# Patient Record
Sex: Male | Born: 1940 | Race: White | Hispanic: No | Marital: Married | Smoking: Former smoker
Health system: Southern US, Community
[De-identification: ages and names within clinical notes are randomized; demographics above are authoritative.]

## PROBLEM LIST (undated history)

## (undated) DIAGNOSIS — M1990 Unspecified osteoarthritis, unspecified site: Secondary | ICD-10-CM

## (undated) DIAGNOSIS — N189 Chronic kidney disease, unspecified: Secondary | ICD-10-CM

## (undated) DIAGNOSIS — I10 Essential (primary) hypertension: Secondary | ICD-10-CM

## (undated) DIAGNOSIS — C801 Malignant (primary) neoplasm, unspecified: Secondary | ICD-10-CM

## (undated) DIAGNOSIS — Z9981 Dependence on supplemental oxygen: Secondary | ICD-10-CM

## (undated) DIAGNOSIS — J189 Pneumonia, unspecified organism: Secondary | ICD-10-CM

## (undated) HISTORY — PX: BACK SURGERY: SHX140

## (undated) HISTORY — PX: OTHER SURGICAL HISTORY: SHX169

## (undated) HISTORY — PX: SINUS SURGERY WITH INSTATRAK: SHX5215

## (undated) HISTORY — PX: ADENOIDECTOMY: SUR15

---

## 2016-01-19 DEATH — deceased

## 2020-07-01 ENCOUNTER — Other Ambulatory Visit: Payer: Self-pay | Admitting: Orthopedic Surgery

## 2020-07-01 DIAGNOSIS — M19012 Primary osteoarthritis, left shoulder: Secondary | ICD-10-CM

## 2020-07-05 ENCOUNTER — Other Ambulatory Visit: Payer: Self-pay | Admitting: Orthopedic Surgery

## 2020-07-20 ENCOUNTER — Ambulatory Visit
Admission: RE | Admit: 2020-07-20 | Discharge: 2020-07-20 | Disposition: A | Discharge status: Home / Self Care | Payer: Medicare Other | Source: Ambulatory Visit | Attending: Orthopedic Surgery | Admitting: Orthopedic Surgery

## 2020-07-20 ENCOUNTER — Other Ambulatory Visit: Payer: Self-pay

## 2020-07-20 DIAGNOSIS — M19012 Primary osteoarthritis, left shoulder: Secondary | ICD-10-CM

## 2020-07-21 NOTE — Progress Notes (Addendum)
DUE TO COVID-19 ONLY ONE VISITOR IS ALLOWED TO COME WITH YOU AND STAY IN THE WAITING ROOM ONLY DURING PRE OP AND PROCEDURE DAY OF SURGERY. THE 1 VISITOR  MAY VISIT WITH YOU AFTER SURGERY IN YOUR PRIVATE ROOM DURING VISITING HOURS ONLY!  YOU NEED TO HAVE A COVID 19 TEST ON__12/01/2020 _____ @_______ , THIS TEST MUST BE DONE BEFORE SURGERY,  COVID TESTING SITE [address] [place] [address], IT IS ON THE RIGHT GOING OUT [address] APPROXIMATELY  2 MINUTES PAST [employer] ON THE RIGHT. ONCE YOUR COVID TEST IS COMPLETED,  PLEASE BEGIN THE QUARANTINE INSTRUCTIONS AS OUTLINED IN YOUR HANDOUT.                Hiran Leard  07/21/2020   Your procedure is scheduled on: 07/28/2020    Report to [facility] Main  Entrance   Report to admitting at   0700am  AM     Call this number if you have problems the morning of surgery [phone]    REMEMBER: NO  SOLID FOOD CANDY OR GUM AFTER MIDNIGHT. CLEAR LIQUIDS UNTIL 0630am         . NOTHING BY MOUTH EXCEPT CLEAR LIQUIDS UNTIL    . PLEASE FINISH ENSURE DRINK PER SURGEON ORDER  WHICH NEEDS TO BE COMPLETED AT 0630am     .      CLEAR LIQUID DIET   Foods Allowed                                                                    Coffee and tea, regular and decaf                            Fruit ices (not with fruit pulp)                                      Iced Popsicles                                    Carbonated beverages, regular and diet                                    Cranberry, grape and apple juices Sports drinks like Gatorade Lightly seasoned clear broth or consume(fat free) Sugar, honey syrup ___________________________________________________________________      BRUSH YOUR TEETH MORNING OF SURGERY AND RINSE YOUR MOUTH OUT, NO CHEWING GUM CANDY OR MINTS.     Take these medicines the morning of surgery with A SIP OF WATER: Coreg, Lyrica   DO NOT TAKE ANY DIABETIC MEDICATIONS DAY OF YOUR SURGERY                                You may not have any metal on your body including hair pins and              piercings  Do not wear  jewelry, make-up, lotions, powders or perfumes, deodorant             Do not wear nail polish on your fingernails.  Do not shave  48 hours prior to surgery.              Men may shave face and neck.   Do not bring valuables to the hospital. [place].  Contacts, dentures or bridgework may not be worn into surgery.  Leave suitcase in the car. After surgery it may be brought to your room.     Patients discharged the day of surgery will not be allowed to drive home. IF YOU ARE HAVING SURGERY AND GOING HOME THE SAME DAY, YOU MUST HAVE AN ADULT TO DRIVE YOU HOME AND BE WITH YOU FOR 24 HOURS. YOU MAY GO HOME BY TAXI OR UBER OR ORTHERWISE, BUT AN ADULT MUST ACCOMPANY YOU HOME AND STAY WITH YOU FOR 24 HOURS.  Name and phone number of your driver:  Special Instructions: N/A              Please read over the following fact sheets you were given: _____________________________________________________________________  [facility] - Preparing for Surgery Before surgery, you can play an important role.  Because skin is not sterile, your skin needs to be as free of germs as possible.  You can reduce the number of germs on your skin by washing with CHG (chlorahexidine gluconate) soap before surgery.  CHG is an antiseptic cleaner which kills germs and bonds with the skin to continue killing germs even after washing. Please DO NOT use if you have an allergy to CHG or antibacterial soaps.  If your skin becomes reddened/irritated stop using the CHG and inform your nurse when you arrive at Short Stay. Do not shave (including legs and underarms) for at least 48 hours prior to the first CHG shower.  You may shave your face/neck. Please follow these instructions carefully:  1.  Shower with CHG Soap the night before surgery and the  morning of  Surgery.  2.  If you choose to wash your hair, wash your hair first as usual with your  normal  shampoo.  3.  After you shampoo, rinse your hair and body thoroughly to remove the  shampoo.                           4.  Use CHG as you would any other liquid soap.  You can apply chg directly  to the skin and wash                       Gently with a scrungie or clean washcloth.  5.  Apply the CHG Soap to your body ONLY FROM THE NECK DOWN.   Do not use on face/ open                           Wound or open sores. Avoid contact with eyes, ears mouth and genitals (private parts).                       Wash face,  Genitals (private parts) with your normal soap.             6.  Wash thoroughly, paying special  attention to the area where your surgery  will be performed.  7.  Thoroughly rinse your body with warm water from the neck down.  8.  DO NOT shower/wash with your normal soap after using and rinsing off  the CHG Soap.                9.  Pat yourself dry with a clean towel.            10.  Wear clean pajamas.            11.  Place clean sheets on your bed the night of your first shower and do not  sleep with pets. Day of Surgery : Do not apply any lotions/deodorants the morning of surgery.  Please wear clean clothes to the hospital/surgery center.  FAILURE TO FOLLOW THESE INSTRUCTIONS MAY RESULT IN THE CANCELLATION OF YOUR SURGERY PATIENT SIGNATURE_________________________________  NURSE SIGNATURE__________________________________  ________________________________________________________________________  [facility]- Preparing for Total Shoulder Arthroplasty    Before surgery, you can play an important role. Because skin is not sterile, your skin needs to be as free of germs as possible. You can reduce the number of germs on your skin by using the following products.  Benzoyl Peroxide Gel o Reduces the number of germs present on the skin o Applied twice a day to shoulder area starting two days  before surgery    ==================================================================  Please follow these instructions carefully:  BENZOYL PEROXIDE 5% GEL  Please do not use if you have an allergy to benzoyl peroxide.   If your skin becomes reddened/irritated stop using the benzoyl peroxide.  Starting two days before surgery, apply as follows: 1. Apply benzoyl peroxide in the morning and at night. Apply after taking a shower. If you are not taking a shower clean entire shoulder front, back, and side along with the armpit with a clean wet washcloth.  2. Place a quarter-sized dollop on your shoulder and rub in thoroughly, making sure to cover the front, back, and side of your shoulder, along with the armpit.   2 days before ____ AM   ____ PM              1 day before ____ AM   ____ PM                         3. Do this twice a day for two days.  (Last application is the night before surgery, AFTER using the CHG soap as described below).  4. Do NOT apply benzoyl peroxide gel on the day of surgery.

## 2020-07-25 ENCOUNTER — Encounter (HOSPITAL_COMMUNITY): Payer: Self-pay

## 2020-07-25 ENCOUNTER — Encounter (HOSPITAL_COMMUNITY)
Admission: RE | Admit: 2020-07-25 | Discharge: 2020-07-25 | Disposition: A | Discharge status: Home / Self Care | Payer: Medicare Other | Source: Ambulatory Visit | Attending: Orthopedic Surgery | Admitting: Orthopedic Surgery

## 2020-07-25 ENCOUNTER — Ambulatory Visit (HOSPITAL_COMMUNITY)
Admission: RE | Admit: 2020-07-25 | Discharge: 2020-07-25 | Disposition: A | Discharge status: Home / Self Care | Payer: Medicare Other | Source: Ambulatory Visit | Attending: Orthopedic Surgery | Admitting: Orthopedic Surgery

## 2020-07-25 ENCOUNTER — Other Ambulatory Visit (HOSPITAL_COMMUNITY)
Admission: RE | Admit: 2020-07-25 | Discharge: 2020-07-25 | Disposition: A | Discharge status: Home / Self Care | Payer: Medicare Other | Source: Ambulatory Visit | Attending: Orthopedic Surgery | Admitting: Orthopedic Surgery

## 2020-07-25 ENCOUNTER — Other Ambulatory Visit: Payer: Self-pay

## 2020-07-25 DIAGNOSIS — Z79899 Other long term (current) drug therapy: Secondary | ICD-10-CM | POA: Insufficient documentation

## 2020-07-25 DIAGNOSIS — Z01818 Encounter for other preprocedural examination: Secondary | ICD-10-CM | POA: Diagnosis present

## 2020-07-25 DIAGNOSIS — Z87891 Personal history of nicotine dependence: Secondary | ICD-10-CM | POA: Diagnosis not present

## 2020-07-25 DIAGNOSIS — Z20822 Contact with and (suspected) exposure to covid-19: Secondary | ICD-10-CM | POA: Insufficient documentation

## 2020-07-25 DIAGNOSIS — Z7982 Long term (current) use of aspirin: Secondary | ICD-10-CM | POA: Insufficient documentation

## 2020-07-25 DIAGNOSIS — R918 Other nonspecific abnormal finding of lung field: Secondary | ICD-10-CM | POA: Insufficient documentation

## 2020-07-25 DIAGNOSIS — N182 Chronic kidney disease, stage 2 (mild): Secondary | ICD-10-CM | POA: Diagnosis not present

## 2020-07-25 DIAGNOSIS — Z01811 Encounter for preprocedural respiratory examination: Secondary | ICD-10-CM

## 2020-07-25 DIAGNOSIS — Z01812 Encounter for preprocedural laboratory examination: Secondary | ICD-10-CM | POA: Insufficient documentation

## 2020-07-25 DIAGNOSIS — I129 Hypertensive chronic kidney disease with stage 1 through stage 4 chronic kidney disease, or unspecified chronic kidney disease: Secondary | ICD-10-CM | POA: Diagnosis not present

## 2020-07-25 DIAGNOSIS — M19012 Primary osteoarthritis, left shoulder: Secondary | ICD-10-CM | POA: Insufficient documentation

## 2020-07-25 HISTORY — DX: Unspecified osteoarthritis, unspecified site: M19.90

## 2020-07-25 HISTORY — DX: Essential (primary) hypertension: I10

## 2020-07-25 HISTORY — DX: Malignant (primary) neoplasm, unspecified: C80.1

## 2020-07-25 HISTORY — DX: Chronic kidney disease, unspecified: N18.9

## 2020-07-25 HISTORY — DX: Pneumonia, unspecified organism: J18.9

## 2020-07-25 HISTORY — DX: Dependence on supplemental oxygen: Z99.81

## 2020-07-25 LAB — URINALYSIS, ROUTINE W REFLEX MICROSCOPIC
Bilirubin Urine: NEGATIVE
Glucose, UA: NEGATIVE mg/dL
Hgb urine dipstick: NEGATIVE
Ketones, ur: NEGATIVE mg/dL
Leukocytes,Ua: NEGATIVE
Nitrite: NEGATIVE
Protein, ur: 30 mg/dL — AB
Specific Gravity, Urine: 1.02 (ref 1.005–1.030)
pH: 5 (ref 5.0–8.0)

## 2020-07-25 LAB — COMPREHENSIVE METABOLIC PANEL
ALT: 24 U/L (ref 0–44)
AST: 29 U/L (ref 15–41)
Albumin: 3.8 g/dL (ref 3.5–5.0)
Alkaline Phosphatase: 49 U/L (ref 38–126)
Anion gap: 9 (ref 5–15)
BUN: 26 mg/dL — ABNORMAL HIGH (ref 8–23)
CO2: 26 mmol/L (ref 22–32)
Calcium: 9.1 mg/dL (ref 8.9–10.3)
Chloride: 105 mmol/L (ref 98–111)
Creatinine, Ser: 1.47 mg/dL — ABNORMAL HIGH (ref 0.61–1.24)
GFR, Estimated: 49 mL/min — ABNORMAL LOW (ref >60)
Glucose, Bld: 100 mg/dL — ABNORMAL HIGH (ref 70–99)
Potassium: 4.1 mmol/L (ref 3.5–5.1)
Sodium: 140 mmol/L (ref 135–145)
Total Bilirubin: 0.7 mg/dL (ref 0.3–1.2)
Total Protein: 7.1 g/dL (ref 6.5–8.1)

## 2020-07-25 LAB — CBC WITH DIFFERENTIAL/PLATELET
Abs Immature Granulocytes: 0.02 10*3/uL (ref 0.00–0.07)
Basophils Absolute: 0 10*3/uL (ref 0.0–0.1)
Basophils Relative: 1 %
Eosinophils Absolute: 0.3 10*3/uL (ref 0.0–0.5)
Eosinophils Relative: 6 %
HCT: 49 % (ref 39.0–52.0)
Hemoglobin: 15.5 g/dL (ref 13.0–17.0)
Immature Granulocytes: 1 %
Lymphocytes Relative: 22 %
Lymphs Abs: 1 10*3/uL (ref 0.7–4.0)
MCH: 29 pg (ref 26.0–34.0)
MCHC: 31.6 g/dL (ref 30.0–36.0)
MCV: 91.6 fL (ref 80.0–100.0)
Monocytes Absolute: 0.5 10*3/uL (ref 0.1–1.0)
Monocytes Relative: 11 %
Neutro Abs: 2.6 10*3/uL (ref 1.7–7.7)
Neutrophils Relative %: 59 %
Platelets: 161 10*3/uL (ref 150–400)
RBC: 5.35 MIL/uL (ref 4.22–5.81)
RDW: 16.9 % — ABNORMAL HIGH (ref 11.5–15.5)
WBC: 4.4 10*3/uL (ref 4.0–10.5)
nRBC: 0 % (ref 0.0–0.2)

## 2020-07-25 LAB — SARS CORONAVIRUS 2 (TAT 6-24 HRS): SARS Coronavirus 2: NEGATIVE

## 2020-07-25 LAB — SURGICAL PCR SCREEN
MRSA, PCR: NEGATIVE
Staphylococcus aureus: NEGATIVE

## 2020-07-25 LAB — PROTIME-INR
INR: 1.1 (ref 0.8–1.2)
Prothrombin Time: 13.3 seconds (ref 11.4–15.2)

## 2020-07-25 LAB — TYPE AND SCREEN
ABO/RH(D): AB POS
Antibody Screen: NEGATIVE

## 2020-07-25 LAB — APTT: aPTT: 31 seconds (ref 24–36)

## 2020-07-25 NOTE — Progress Notes (Addendum)
Anesthesia Review:  PCP: DR Franki Monte [facility]- 07/15/20   Cardiologist : DR Lamar Blinks- [facility]- 10/2019 Have requested clearance from Bethena Roys at [facility] - they are to fax  Clearance Dr Mauricio Po. On chart dated 07/05/2020.  Chest x-ray :07/25/2020  EKG :07/25/2020  Echo : Stress test: Cardiac Cath :  Activity level: cannot do a flight of stairs without difficulty  Sleep Study/ CPAP :no  Fasting Blood Sugar :      / Checks Blood Sugar -- times a day:   Blood Thinner/ Instructions /Last Dose: ASA / Instructions/ Last Dose :  Due to Covid in 07/2019 is on oxygen 2l at hs per pt  U/A and CMP  done 07/25/2020 routed to Dr Tamera Punt.  CXR done 07/25/2020 routed via epic to DR Boeing.

## 2020-07-27 MED ORDER — DEXTROSE 5 % IV SOLN
3.0000 g | INTRAVENOUS | Status: AC
  Administered 2020-07-28: 2 g via INTRAVENOUS
  Filled 2020-07-27: qty 3

## 2020-07-28 ENCOUNTER — Observation Stay (HOSPITAL_COMMUNITY): Payer: Medicare Other

## 2020-07-28 ENCOUNTER — Encounter (HOSPITAL_COMMUNITY)
Admission: RE | Disposition: A | Discharge status: Home / Self Care | Payer: Self-pay | Source: Home / Self Care | Attending: Orthopedic Surgery

## 2020-07-28 ENCOUNTER — Ambulatory Visit (HOSPITAL_COMMUNITY): Payer: Medicare Other | Admitting: Anesthesiology

## 2020-07-28 ENCOUNTER — Observation Stay (HOSPITAL_COMMUNITY)
Admission: RE | Admit: 2020-07-28 | Discharge: 2020-07-29 | Disposition: A | Discharge status: Home / Self Care | Payer: Medicare Other | Attending: Orthopedic Surgery | Admitting: Orthopedic Surgery

## 2020-07-28 ENCOUNTER — Ambulatory Visit (HOSPITAL_COMMUNITY): Payer: Medicare Other | Admitting: Physician Assistant

## 2020-07-28 DIAGNOSIS — M19012 Primary osteoarthritis, left shoulder: Secondary | ICD-10-CM | POA: Diagnosis present

## 2020-07-28 DIAGNOSIS — Z7982 Long term (current) use of aspirin: Secondary | ICD-10-CM | POA: Insufficient documentation

## 2020-07-28 DIAGNOSIS — N182 Chronic kidney disease, stage 2 (mild): Secondary | ICD-10-CM | POA: Diagnosis not present

## 2020-07-28 DIAGNOSIS — I129 Hypertensive chronic kidney disease with stage 1 through stage 4 chronic kidney disease, or unspecified chronic kidney disease: Secondary | ICD-10-CM | POA: Diagnosis not present

## 2020-07-28 DIAGNOSIS — Z79899 Other long term (current) drug therapy: Secondary | ICD-10-CM | POA: Insufficient documentation

## 2020-07-28 DIAGNOSIS — Z96612 Presence of left artificial shoulder joint: Secondary | ICD-10-CM

## 2020-07-28 HISTORY — PX: REVERSE SHOULDER ARTHROPLASTY: SHX5054

## 2020-07-28 LAB — ABO/RH: ABO/RH(D): AB POS

## 2020-07-28 SURGERY — ARTHROPLASTY, SHOULDER, TOTAL, REVERSE
Anesthesia: General | Site: Shoulder | Laterality: Left

## 2020-07-28 MED ORDER — ROSUVASTATIN CALCIUM 5 MG PO TABS
5.0000 mg | ORAL_TABLET | Freq: Every evening | ORAL | Status: DC
  Administered 2020-07-28: 5 mg via ORAL
  Filled 2020-07-28: qty 1

## 2020-07-28 MED ORDER — HYDROCHLOROTHIAZIDE 25 MG PO TABS
25.0000 mg | ORAL_TABLET | Freq: Every day | ORAL | Status: DC
  Administered 2020-07-28 – 2020-07-29 (×2): 25 mg via ORAL
  Filled 2020-07-28 (×2): qty 1

## 2020-07-28 MED ORDER — LIDOCAINE 2% (20 MG/ML) 5 ML SYRINGE
INTRAMUSCULAR | Status: DC | PRN
  Administered 2020-07-28: 100 mg via INTRAVENOUS

## 2020-07-28 MED ORDER — ONDANSETRON HCL 4 MG/2ML IJ SOLN
INTRAMUSCULAR | Status: DC | PRN
  Administered 2020-07-28: 4 mg via INTRAVENOUS

## 2020-07-28 MED ORDER — BISACODYL 5 MG PO TBEC: 5.0000 mg | DELAYED_RELEASE_TABLET | Freq: Every day | ORAL | Status: DC | PRN

## 2020-07-28 MED ORDER — POTASSIUM CHLORIDE IN NACL 20-0.45 MEQ/L-% IV SOLN
INTRAVENOUS | Status: DC
  Filled 2020-07-28 (×3): qty 1000

## 2020-07-28 MED ORDER — ONDANSETRON HCL 4 MG/2ML IJ SOLN: 4.0000 mg | Freq: Once | INTRAMUSCULAR | Status: DC | PRN

## 2020-07-28 MED ORDER — WATER FOR IRRIGATION, STERILE IR SOLN
Status: DC | PRN
  Administered 2020-07-28: 1000 mL

## 2020-07-28 MED ORDER — POLYETHYLENE GLYCOL 3350 17 G PO PACK: 17.0000 g | PACK | Freq: Every day | ORAL | Status: DC | PRN

## 2020-07-28 MED ORDER — MENTHOL 3 MG MT LOZG: 1.0000 | LOZENGE | OROMUCOSAL | Status: DC | PRN

## 2020-07-28 MED ORDER — FLEET ENEMA 7-19 GM/118ML RE ENEM: 1.0000 | ENEMA | Freq: Once | RECTAL | Status: DC | PRN

## 2020-07-28 MED ORDER — FENTANYL CITRATE (PF) 100 MCG/2ML IJ SOLN: 50.0000 ug | INTRAMUSCULAR | Status: AC

## 2020-07-28 MED ORDER — TRANEXAMIC ACID-NACL 1000-0.7 MG/100ML-% IV SOLN
INTRAVENOUS | Status: AC
  Filled 2020-07-28: qty 100

## 2020-07-28 MED ORDER — PROPOFOL 10 MG/ML IV BOLUS
INTRAVENOUS | Status: DC | PRN
  Administered 2020-07-28: 100 mg via INTRAVENOUS
  Administered 2020-07-28: 50 mg via INTRAVENOUS

## 2020-07-28 MED ORDER — SUGAMMADEX SODIUM 200 MG/2ML IV SOLN
INTRAVENOUS | Status: DC | PRN
  Administered 2020-07-28: 200 mg via INTRAVENOUS

## 2020-07-28 MED ORDER — OXYCODONE HCL 5 MG PO TABS: 5.0000 mg | ORAL_TABLET | Freq: Once | ORAL | Status: DC | PRN

## 2020-07-28 MED ORDER — ACETAMINOPHEN 325 MG PO TABS: 325.0000 mg | ORAL_TABLET | ORAL | Status: DC | PRN

## 2020-07-28 MED ORDER — BUPIVACAINE HCL (PF) 0.5 % IJ SOLN
INTRAMUSCULAR | Status: DC | PRN
  Administered 2020-07-28: 15 mL via PERINEURAL

## 2020-07-28 MED ORDER — LACTATED RINGERS IV SOLN: INTRAVENOUS | Status: DC | PRN

## 2020-07-28 MED ORDER — OXYCODONE HCL 5 MG PO TABS
5.0000 mg | ORAL_TABLET | ORAL | Status: DC | PRN
  Administered 2020-07-28 – 2020-07-29 (×3): 10 mg via ORAL
  Filled 2020-07-28 (×3): qty 2

## 2020-07-28 MED ORDER — ONDANSETRON HCL 4 MG/2ML IJ SOLN
INTRAMUSCULAR | Status: AC
  Filled 2020-07-28: qty 2

## 2020-07-28 MED ORDER — CEFAZOLIN SODIUM-DEXTROSE 2-4 GM/100ML-% IV SOLN
2.0000 g | Freq: Four times a day (QID) | INTRAVENOUS | Status: AC
  Administered 2020-07-28 – 2020-07-29 (×3): 2 g via INTRAVENOUS
  Filled 2020-07-28 (×7): qty 100

## 2020-07-28 MED ORDER — PROPOFOL 10 MG/ML IV BOLUS
INTRAVENOUS | Status: AC
  Filled 2020-07-28: qty 20

## 2020-07-28 MED ORDER — DIPHENHYDRAMINE HCL 12.5 MG/5ML PO ELIX: 12.5000 mg | ORAL_SOLUTION | ORAL | Status: DC | PRN

## 2020-07-28 MED ORDER — FENTANYL CITRATE (PF) 100 MCG/2ML IJ SOLN
INTRAMUSCULAR | Status: DC | PRN
  Administered 2020-07-28 (×2): 50 ug via INTRAVENOUS

## 2020-07-28 MED ORDER — HYDROMORPHONE HCL 1 MG/ML IJ SOLN: 0.5000 mg | INTRAMUSCULAR | Status: DC | PRN

## 2020-07-28 MED ORDER — EPHEDRINE 5 MG/ML INJ
INTRAVENOUS | Status: AC
  Filled 2020-07-28: qty 10

## 2020-07-28 MED ORDER — PREGABALIN 100 MG PO CAPS
100.0000 mg | ORAL_CAPSULE | Freq: Two times a day (BID) | ORAL | Status: DC
  Administered 2020-07-28 – 2020-07-29 (×2): 100 mg via ORAL
  Filled 2020-07-28 (×2): qty 1

## 2020-07-28 MED ORDER — FENTANYL CITRATE (PF) 100 MCG/2ML IJ SOLN
INTRAMUSCULAR | Status: AC
  Administered 2020-07-28: 100 ug via INTRAVENOUS
  Filled 2020-07-28: qty 2

## 2020-07-28 MED ORDER — SODIUM CHLORIDE 0.9 % IR SOLN
Status: DC | PRN
  Administered 2020-07-28: 1000 mL

## 2020-07-28 MED ORDER — ROCURONIUM BROMIDE 10 MG/ML (PF) SYRINGE
PREFILLED_SYRINGE | INTRAVENOUS | Status: AC
  Filled 2020-07-28: qty 10

## 2020-07-28 MED ORDER — OXYCODONE HCL 5 MG PO TABS: 10.0000 mg | ORAL_TABLET | ORAL | Status: DC | PRN

## 2020-07-28 MED ORDER — FENTANYL CITRATE (PF) 100 MCG/2ML IJ SOLN
INTRAMUSCULAR | Status: AC
  Filled 2020-07-28: qty 2

## 2020-07-28 MED ORDER — OXYCODONE HCL 5 MG/5ML PO SOLN: 5.0000 mg | Freq: Once | ORAL | Status: DC | PRN

## 2020-07-28 MED ORDER — EPHEDRINE SULFATE-NACL 50-0.9 MG/10ML-% IV SOSY
PREFILLED_SYRINGE | INTRAVENOUS | Status: DC | PRN
  Administered 2020-07-28 (×2): 10 mg via INTRAVENOUS

## 2020-07-28 MED ORDER — TRANEXAMIC ACID-NACL 1000-0.7 MG/100ML-% IV SOLN
1000.0000 mg | INTRAVENOUS | Status: AC
  Administered 2020-07-28: 1000 mg via INTRAVENOUS

## 2020-07-28 MED ORDER — LIDOCAINE HCL (PF) 2 % IJ SOLN
INTRAMUSCULAR | Status: AC
  Filled 2020-07-28: qty 5

## 2020-07-28 MED ORDER — METOCLOPRAMIDE HCL 5 MG/ML IJ SOLN: 5.0000 mg | Freq: Three times a day (TID) | INTRAMUSCULAR | Status: DC | PRN

## 2020-07-28 MED ORDER — ONDANSETRON HCL 4 MG/2ML IJ SOLN: 4.0000 mg | Freq: Four times a day (QID) | INTRAMUSCULAR | Status: DC | PRN

## 2020-07-28 MED ORDER — MEPERIDINE HCL 50 MG/ML IJ SOLN: 6.2500 mg | INTRAMUSCULAR | Status: DC | PRN

## 2020-07-28 MED ORDER — MIDAZOLAM HCL 2 MG/2ML IJ SOLN
INTRAMUSCULAR | Status: AC
  Filled 2020-07-28: qty 2

## 2020-07-28 MED ORDER — METOCLOPRAMIDE HCL 5 MG PO TABS
5.0000 mg | ORAL_TABLET | Freq: Three times a day (TID) | ORAL | Status: DC | PRN
  Filled 2020-07-28: qty 2

## 2020-07-28 MED ORDER — ROCURONIUM BROMIDE 10 MG/ML (PF) SYRINGE
PREFILLED_SYRINGE | INTRAVENOUS | Status: DC | PRN
  Administered 2020-07-28: 70 mg via INTRAVENOUS

## 2020-07-28 MED ORDER — DOCUSATE SODIUM 100 MG PO CAPS
100.0000 mg | ORAL_CAPSULE | Freq: Two times a day (BID) | ORAL | Status: DC
  Administered 2020-07-28 – 2020-07-29 (×2): 100 mg via ORAL
  Filled 2020-07-28 (×2): qty 1

## 2020-07-28 MED ORDER — FENTANYL CITRATE (PF) 100 MCG/2ML IJ SOLN: 25.0000 ug | INTRAMUSCULAR | Status: DC | PRN

## 2020-07-28 MED ORDER — DEXAMETHASONE SODIUM PHOSPHATE 4 MG/ML IJ SOLN: INTRAMUSCULAR | Status: DC | PRN

## 2020-07-28 MED ORDER — PHENOL 1.4 % MT LIQD: 1.0000 | OROMUCOSAL | Status: DC | PRN

## 2020-07-28 MED ORDER — BUPIVACAINE LIPOSOME 1.3 % IJ SUSP
INTRAMUSCULAR | Status: DC | PRN
  Administered 2020-07-28: 10 mL via PERINEURAL

## 2020-07-28 MED ORDER — ACETAMINOPHEN 160 MG/5ML PO SOLN: 325.0000 mg | ORAL | Status: DC | PRN

## 2020-07-28 MED ORDER — ACETAMINOPHEN 500 MG PO TABS
1000.0000 mg | ORAL_TABLET | Freq: Four times a day (QID) | ORAL | Status: DC
  Administered 2020-07-28 – 2020-07-29 (×2): 1000 mg via ORAL
  Filled 2020-07-28 (×2): qty 2

## 2020-07-28 MED ORDER — 0.9 % SODIUM CHLORIDE (POUR BTL) OPTIME
TOPICAL | Status: DC | PRN
  Administered 2020-07-28: 1000 mL

## 2020-07-28 MED ORDER — CEPHALEXIN 500 MG PO CAPS
500.0000 mg | ORAL_CAPSULE | Freq: Two times a day (BID) | ORAL | Status: DC
  Administered 2020-07-29: 500 mg via ORAL
  Filled 2020-07-28: qty 1

## 2020-07-28 MED ORDER — CARVEDILOL 6.25 MG PO TABS
6.2500 mg | ORAL_TABLET | Freq: Two times a day (BID) | ORAL | Status: DC
  Administered 2020-07-28 – 2020-07-29 (×2): 6.25 mg via ORAL
  Filled 2020-07-28 (×2): qty 1

## 2020-07-28 MED ORDER — ALUM & MAG HYDROXIDE-SIMETH 200-200-20 MG/5ML PO SUSP: 30.0000 mL | ORAL | Status: DC | PRN

## 2020-07-28 MED ORDER — ONDANSETRON HCL 4 MG PO TABS
4.0000 mg | ORAL_TABLET | Freq: Four times a day (QID) | ORAL | Status: DC | PRN
  Filled 2020-07-28: qty 1

## 2020-07-28 SURGICAL SUPPLY — 73 items
BAG ZIPLOCK 12X15 (MISCELLANEOUS) ×2 IMPLANT
BASEPLATE SHOULDER FW 15D 29 (Joint) ×2 IMPLANT
BIT DRILL 1.6MX128 (BIT) IMPLANT
BIT DRILL 3.2 PERIPHERAL SCREW (BIT) ×2 IMPLANT
BLADE SAW SAG 73X25 THK (BLADE) ×1
BLADE SAW SGTL 73X25 THK (BLADE) ×1 IMPLANT
BOOTIES KNEE HIGH SLOAN (MISCELLANEOUS) ×4 IMPLANT
CLSR STERI-STRIP ANTIMIC 1/2X4 (GAUZE/BANDAGES/DRESSINGS) ×2 IMPLANT
COOLER ICEMAN CLASSIC (MISCELLANEOUS) IMPLANT
COVER BACK TABLE 60X90IN (DRAPES) ×2 IMPLANT
COVER SURGICAL LIGHT HANDLE (MISCELLANEOUS) ×2 IMPLANT
COVER WAND RF STERILE (DRAPES) IMPLANT
DRAPE INCISE IOBAN 66X45 STRL (DRAPES) ×2 IMPLANT
DRAPE ORTHO SPLIT 77X108 STRL (DRAPES) ×2
DRAPE POUCH INSTRU U-SHP 10X18 (DRAPES) ×2 IMPLANT
DRAPE SHEET LG 3/4 BI-LAMINATE (DRAPES) ×2 IMPLANT
DRAPE SURG 17X11 SM STRL (DRAPES) ×2 IMPLANT
DRAPE SURG ORHT 6 SPLT 77X108 (DRAPES) ×2 IMPLANT
DRAPE TOP 10253 STERILE (DRAPES) ×2 IMPLANT
DRAPE U-SHAPE 47X51 STRL (DRAPES) ×2 IMPLANT
DRSG AQUACEL AG ADV 3.5X 6 (GAUZE/BANDAGES/DRESSINGS) ×2 IMPLANT
DURAPREP 26ML APPLICATOR (WOUND CARE) ×2 IMPLANT
ELECT BLADE TIP CTD 4 INCH (ELECTRODE) ×2 IMPLANT
ELECT REM PT RETURN 15FT ADLT (MISCELLANEOUS) ×2 IMPLANT
GLENOSPHERE STANDARD 39 (Joint) ×2 IMPLANT
GLENOSPHERE STD 39 (Joint) ×1 IMPLANT
GLOVE BIO SURGEON STRL SZ7 (GLOVE) ×2 IMPLANT
GLOVE BIO SURGEON STRL SZ7.5 (GLOVE) ×2 IMPLANT
GLOVE BIOGEL PI IND STRL 7.0 (GLOVE) ×1 IMPLANT
GLOVE BIOGEL PI IND STRL 8 (GLOVE) ×1 IMPLANT
GLOVE BIOGEL PI INDICATOR 7.0 (GLOVE) ×1
GLOVE BIOGEL PI INDICATOR 8 (GLOVE) ×1
GOWN STRL REUS W/TWL LRG LVL3 (GOWN DISPOSABLE) ×2 IMPLANT
GOWN STRL REUS W/TWL XL LVL3 (GOWN DISPOSABLE) ×2 IMPLANT
GUIDEWIRE GLENOID 2.5X220 (WIRE) ×2 IMPLANT
HANDPIECE INTERPULSE COAX TIP (DISPOSABLE) ×1
HOOD PEEL AWAY FLYTE STAYCOOL (MISCELLANEOUS) ×6 IMPLANT
INSERT EPOLY STND HUMERUS+4 40 (Shoulder) ×2 IMPLANT
INSERT EPOLYSTD HUMERUS+4 40 (Shoulder) ×1 IMPLANT
KIT BASIN OR (CUSTOM PROCEDURE TRAY) ×2 IMPLANT
KIT TURNOVER KIT A (KITS) IMPLANT
MANIFOLD NEPTUNE II (INSTRUMENTS) ×2 IMPLANT
NEEDLE TROCAR POINT SZ 2 1/2 (NEEDLE) IMPLANT
NS IRRIG 1000ML POUR BTL (IV SOLUTION) ×2 IMPLANT
PACK SHOULDER (CUSTOM PROCEDURE TRAY) ×2 IMPLANT
PAD COLD SHLDR WRAP-ON (PAD) IMPLANT
PROTECTOR NERVE ULNAR (MISCELLANEOUS) IMPLANT
RESTRAINT HEAD UNIVERSAL NS (MISCELLANEOUS) IMPLANT
RETRIEVER SUT HEWSON (MISCELLANEOUS) IMPLANT
SCREW 5.5X26 (Screw) ×4 IMPLANT
SCREW BONE SMALL REVERSED 15 (Screw) ×2 IMPLANT
SCREW PERIPHERAL 5.0X34 (Screw) ×4 IMPLANT
SET HNDPC FAN SPRY TIP SCT (DISPOSABLE) ×1 IMPLANT
SLING ARM FOAM STRAP XLG (SOFTGOODS) ×2 IMPLANT
SLING ARM IMMOBILIZER LRG (SOFTGOODS) IMPLANT
SLING ARM IMMOBILIZER MED (SOFTGOODS) IMPLANT
SPONGE LAP 18X18 RF (DISPOSABLE) ×2 IMPLANT
STEM HUMERAL 12X48 STD SHORT (Shoulder) ×2 IMPLANT
STRIP CLOSURE SKIN 1/2X4 (GAUZE/BANDAGES/DRESSINGS) ×4 IMPLANT
SUCTION FRAZIER HANDLE 10FR (MISCELLANEOUS)
SUCTION TUBE FRAZIER 10FR DISP (MISCELLANEOUS) IMPLANT
SUPPORT WRAP ARM LG (MISCELLANEOUS) IMPLANT
SUT ETHIBOND 2 V 37 (SUTURE) IMPLANT
SUT FIBERWIRE #2 38 REV NDL BL (SUTURE)
SUT MNCRL AB 4-0 PS2 18 (SUTURE) ×2 IMPLANT
SUT VIC AB 2-0 CT1 27 (SUTURE) ×2
SUT VIC AB 2-0 CT1 TAPERPNT 27 (SUTURE) ×2 IMPLANT
SUTURE FIBERWR#2 38 REV NDL BL (SUTURE) IMPLANT
TAPE LABRALWHITE 1.5X36 (TAPE) IMPLANT
TAPE SUT LABRALTAP WHT/BLK (SUTURE) IMPLANT
TOWEL OR 17X26 10 PK STRL BLUE (TOWEL DISPOSABLE) ×2 IMPLANT
TOWEL OR NON WOVEN STRL DISP B (DISPOSABLE) ×2 IMPLANT
WATER STERILE IRR 1000ML POUR (IV SOLUTION) ×2 IMPLANT

## 2020-07-28 NOTE — Transfer of Care (Signed)
Immediate Anesthesia Transfer of Care Note  Patient: Timothy Ashley  Procedure(s) Performed: REVERSE SHOULDER ARTHROPLASTY (Left Shoulder)  Patient Location: PACU  Anesthesia Type:GA combined with regional for post-op pain  Level of Consciousness: awake, alert , oriented and patient cooperative  Airway & Oxygen Therapy: Patient Spontanous Breathing and Patient connected to face mask  Post-op Assessment: Report given to RN and Post -op Vital signs reviewed and stable  Post vital signs: Reviewed and stable  Last Vitals:  Vitals Value Taken Time  BP 148/74 07/28/20 1143  Temp    Pulse 73 07/28/20 1144  Resp 18 07/28/20 1144  SpO2 97 % 07/28/20 1144  Vitals shown include unvalidated device data.  Last Pain:  Vitals:   07/28/20 0812  TempSrc: Oral  PainSc:       Patients Stated Pain Goal: 2 (69/99/67 2277)  Complications: No complications documented.

## 2020-07-28 NOTE — Anesthesia Procedure Notes (Signed)
Anesthesia Regional Block: Femoral nerve block   Pre-Anesthetic Checklist: ,, timeout performed, Correct Patient, Correct Site, Correct Laterality, Correct Procedure, Correct Position, site marked, Risks and benefits discussed,  Surgical consent,  Pre-op evaluation,  At surgeon's request and post-op pain management  Laterality: Left  Prep: chloraprep       Needles:  Injection technique: Single-shot  Needle Type: Echogenic Stimulator Needle     Needle Length: 5cm  Needle Gauge: 22     Additional Needles:   Procedures:, nerve stimulator,,, ultrasound used (permanent image in chart),,,,   Nerve Stimulator or Paresthesia:  Response: hand, 0.45 mA,   Additional Responses:   Narrative:  Start time: 07/28/2020 8:55 AM End time: 07/28/2020 9:00 AM Injection made incrementally with aspirations every 5 mL.  Performed by: Personally  Anesthesiologist: Janeece Riggers, MD  Additional Notes: Functioning IV was confirmed and monitors were applied.  A 76mm 22ga Arrow echogenic stimulator needle was used. Sterile prep and drape,hand hygiene and sterile gloves were used. Ultrasound guidance: relevant anatomy identified, needle position confirmed, local anesthetic spread visualized around nerve(s)., vascular puncture avoided.  Image printed for medical record. Negative aspiration and negative test dose prior to incremental administration of local anesthetic. The patient tolerated the procedure well.

## 2020-07-28 NOTE — Progress Notes (Signed)
Assisted Dr. Oddono with left, ultrasound guided, interscalene  block. Side rails up, monitors on throughout procedure. See vital signs in flow sheet. Tolerated Procedure well. 

## 2020-07-28 NOTE — H&P (Signed)
Timothy Ashley is an 79 y.o. male.   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  Pain interferes with sleep and quality of life.   Past Medical History:  Diagnosis Date  . Arthritis   . Cancer (HCC)    hx of skin cancer   . Chronic kidney disease    stage 2 -   . Hypertension   . 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 Pneumonia     No family history on file. Social History:  reports that he has quit smoking. He has never used smokeless tobacco. He reports current alcohol use of about 7.0 standard drinks of alcohol per week. He reports that he does not use drugs.  Allergies:  Allergies  Allergen Reactions  . Statins Anxiety and Other (See Comments)    Myalgia     . Nsaids     Stage 2 kidney failure   . Other     Muscle Relaxants - stage 2 Kidney failure   . Tegaderm Ag Mesh [Silver]     Causes blisters on skin     Medications Prior to Admission  Medication Sig Dispense Refill  . aspirin EC 81 MG tablet Take 81 mg by mouth daily. Swallow whole.    . b complex vitamins capsule Take 1 capsule by mouth daily.    . Calcium Carb-Cholecalciferol (CALCIUM + VITAMIN D3 PO) Take 1 tablet by mouth in the morning and at bedtime. 1200 mg /1000 units    . carvedilol (COREG) 6.25 MG tablet Take 6.25 mg by mouth 2 (two) times daily with a meal.    . cephALEXin (KEFLEX) 500 MG capsule Take 500 mg by mouth 2 (two) times daily.    . cholecalciferol (VITAMIN D3) 25 MCG (1000 UNIT) tablet Take 1,000 Units by mouth daily.    . Coenzyme Q10 60 MG TABS Take 60 mg by mouth daily. With Red Yeast Rice    . hydrochlorothiazide (HYDRODIURIL) 25 MG tablet Take 25 mg by mouth daily.    Marland Kitchen latanoprost (XALATAN) 0.005 % ophthalmic solution Place 1 drop into both eyes at bedtime.    Marland Kitchen loratadine (CLARITIN) 10 MG tablet Take 10 mg by mouth at bedtime.    . Misc Natural  Products (COMPLETE PROSTATE HEALTH PO) Take 1 capsule by mouth in the morning and at bedtime. Super Beta Prostate Advanced    . OVER THE COUNTER MEDICATION Take 2 capsules by mouth in the morning and at bedtime. Swiss Kres Herbal Laxative    . oxyCODONE-acetaminophen (PERCOCET/ROXICET) 5-325 MG tablet Take 1 tablet by mouth in the morning, at noon, in the evening, and at bedtime.    . polyethylene glycol (MIRALAX / GLYCOLAX) 17 g packet Take 17 g by mouth daily. Clear Lax    . pregabalin (LYRICA) 100 MG capsule Take 100 mg by mouth 2 (two) times daily.    . rosuvastatin (CRESTOR) 5 MG tablet Take 5 mg by mouth every evening.    . salsalate (DISALCID) 500 MG tablet Take 500 mg by mouth 2 (two) times daily.    . sennosides-docusate sodium (SENOKOT-S) 8.6-50 MG tablet Take 2 tablets by mouth at bedtime.    . TURMERIC PO Take 800 mg by mouth in the morning and at bedtime.    . vitamin B-12 (CYANOCOBALAMIN) 1000 MCG tablet Take 1,000 mcg by mouth daily.    Marland Kitchen testosterone  cypionate (DEPOTESTOTERONE CYPIONATE) 100 MG/ML injection Inject 100 mg into the muscle every 14 (fourteen) days. For IM use only      No results found for this or any previous visit (from the past 48 hour(s)). No results found.  Review of Systems  Blood pressure (!) 173/87, pulse 67, temperature 98.4 F (36.9 C), temperature source Oral, resp. rate 16, height 6' (1.829 m), weight 123 kg, SpO2 99 %. Physical Exam   Assessment/Plan L shoulder shoulder advanced arthritis with significant posterior glenoid bone loss Plan L reverse TSA with augmented glenoid. Risks / benefits of surgery discussed Consent on chart  NPO for OR Preop antibiotics   Isabella Stalling, MD 07/28/2020, 8:56 AM

## 2020-07-28 NOTE — Anesthesia Procedure Notes (Signed)
Procedure Name: Intubation Date/Time: 07/28/2020 9:57 AM Performed by: Claudia Desanctis, CRNA Pre-anesthesia Checklist: Patient identified, Emergency Drugs available, Suction available and Patient being monitored Patient Re-evaluated:Patient Re-evaluated prior to induction Oxygen Delivery Method: Circle system utilized Preoxygenation: Pre-oxygenation with 100% oxygen Induction Type: IV induction Ventilation: Mask ventilation without difficulty Laryngoscope Size: 2 and Miller Grade View: Grade I Tube type: Oral Tube size: 8.0 mm Number of attempts: 1 Airway Equipment and Method: Stylet Placement Confirmation: ETT inserted through vocal cords under direct vision,  positive ETCO2 and breath sounds checked- equal and bilateral Secured at: 22 cm Tube secured with: Tape Dental Injury: Teeth and Oropharynx as per pre-operative assessment

## 2020-07-28 NOTE — Op Note (Signed)
Procedure(s): REVERSE SHOULDER ARTHROPLASTY Procedure Note  Timothy Ashley male 79 y.o. 07/28/2020  Preoperative diagnosis: Left shoulder end-stage arthritis with severe posterior glenoid bone loss and rotator cuff pathology  Postoperative diagnosis: Same  Procedure(s) and Anesthesia Type:    Left REVERSE SHOULDER ARTHROPLASTY - Choice   Indications:  79 y.o. male  With endstage left shoulder arthritis with severe posterior bone loss and likely rotator cuff tear. Pain and dysfunction interfered with quality of life and nonoperative treatment with activity modification, NSAIDS and injections failed.     Surgeon: Isabella Stalling   Assistants: Jeanmarie Hubert PA-C [facility] was present and scrubbed throughout the procedure and was essential in positioning, retraction, exposure, and closure)  Anesthesia: General endotracheal anesthesia with preoperative interscalene block given by the attending anesthesiologist     Procedure Detail  REVERSE SHOULDER ARTHROPLASTY   Estimated Blood Loss:  200 mL         Drains: none  Blood Given: none          Specimens: none        Complications:  * No complications entered in OR log *         Disposition: PACU - hemodynamically stable.         Condition: stable      OPERATIVE FINDINGS:  A DJO Altivate pressfit reverse total shoulder arthroplasty stem was placed with a Tornier augmented glenoid baseplate, size 39. size 12 stem with a 40 mm poly +4, a 39 glenosphere, The base plate  fixation was excellent.  PROCEDURE: The patient was identified in the preoperative holding area  where I personally marked the operative site after verifying site, side,  and procedure with the patient. An interscalene block given by  the attending anesthesiologist in the holding area and the patient was taken back to the operating room where all extremities were  carefully padded in position after general anesthesia was induced. She  was placed in a  beach-chair position and the operative upper extremity was  prepped and draped in a standard sterile fashion. An approximately 10-  cm incision was made from the tip of the coracoid process to the center  point of the humerus at the level of the axilla. Dissection was carried  down through subcutaneous tissues to the level of the cephalic vein  which was taken laterally with the deltoid. The pectoralis major was  retracted medially. The subdeltoid space was developed and the lateral  edge of the conjoined tendon was identified. The undersurface of  conjoined tendon was palpated and the musculocutaneous nerve was not in  the field. Retractor was placed underneath the conjoined and second  retractor was placed lateral into the deltoid. The circumflex humeral  artery and vessels were identified and clamped and coagulated. The  biceps tendon was tenotomized.  The subscapularis was taken down as a peel with the underlying capsule.  The  joint was then gently externally rotated while the capsule was released  from the humeral neck around to just beyond the 6 o'clock position. At  this point, the joint was dislocated and the humeral head was presented  into the wound. The excessive osteophyte formation was removed with a  large rongeur.  The cutting guide was used to make the appropriate  head cut and the head was saved for potentially bone grafting.  The glenoid was exposed with the arm in an  abducted extended position. The anterior and posterior labrum were  completely excised and the capsule was released  circumferentially to  allow for exposure of the glenoid for preparation.  The angled augmented guide was used to place the guidepin.  The glenoid was then prepared appropriately with the angled reamer and the central drill.  The augmented baseplate with a full wedge was placed with a central post with excellent initial fixation.  The 4 peripheral screws were then drilled measured and filled with  appropriate size screws.  The fixation was felt to be excellent.  The size 39 glenosphere was then impacted and screwed in place.   The humerus was then again exposed and the diaphyseal reamers were used followed by the metaphyseal reamers. The final broach was left in place in the proximal trial was placed. The joint was reduced and with this implant it was felt that soft tissue tensioning was appropriate with excellent stability and excellent range of motion. Therefore, final humeral stem was placed press-fit.  And then the trial polyethylene inserts were tested again and the above implant was felt to be the most appropriate for final insertion. The joint was reduced taken through full range of motion and felt to be stable. Soft tissue tension was appropriate.  The joint was then copiously irrigated with pulse  lavage and the wound was then closed. The subscapularis was not repaired.  Skin was closed with 2-0 Vicryl in a deep dermal layer and 4-0  Monocryl for skin closure. Steri-Strips were applied. Sterile  dressings were then applied as well as a sling. The patient was allowed  to awaken from general anesthesia, transferred to stretcher, and taken  to recovery room in stable condition.   POSTOPERATIVE PLAN: The patient will be kept in the hospital postoperatively  for observation, pain control and therapy.

## 2020-07-28 NOTE — Anesthesia Preprocedure Evaluation (Signed)
Anesthesia Evaluation  Patient identified by MRN, date of birth, ID band Patient awake    Reviewed: Allergy & Precautions, H&P , NPO status , Patient's Chart, lab work & pertinent test results, reviewed documented beta blocker date and time   Airway Mallampati: II  TM Distance: >3 FB Neck ROM: full    Dental no notable dental hx. (+) Teeth Intact, Dental Advisory Given   Pulmonary neg pulmonary ROS, former smoker,    Pulmonary exam normal breath sounds clear to auscultation       Cardiovascular Exercise Tolerance: Good hypertension, Pt. on medications negative cardio ROS   Rhythm:regular Rate:Normal     Neuro/Psych negative neurological ROS  negative psych ROS   GI/Hepatic negative GI ROS, Neg liver ROS,   Endo/Other  Morbid obesity  Renal/GU CRFRenal disease  negative genitourinary   Musculoskeletal  (+) Arthritis ,   Abdominal (+) + obese,   Peds  Hematology negative hematology ROS (+)   Anesthesia Other Findings   Reproductive/Obstetrics negative OB ROS                            Anesthesia Physical Anesthesia Plan  ASA: III  Anesthesia Plan: General   Post-op Pain Management: GA combined w/ Regional for post-op pain   Induction: Intravenous  PONV Risk Score and Plan: 2 and Ondansetron, Treatment may vary due to age or medical condition and Midazolam  Airway Management Planned: Oral ETT and LMA  Additional Equipment: None  Intra-op Plan:   Post-operative Plan: Extubation in OR  Informed Consent: I have reviewed the patients History and Physical, chart, labs and discussed the procedure including the risks, benefits and alternatives for the proposed anesthesia with the patient or authorized representative who has indicated his/her understanding and acceptance.     Dental Advisory Given  Plan Discussed with: CRNA and Anesthesiologist  Anesthesia Plan Comments:  (Discussed both nerve block for pain relief post-op and GA; including NV, sore throat, dental injury, and pulmonary complications)       Anesthesia Quick Evaluation

## 2020-07-29 DIAGNOSIS — M19012 Primary osteoarthritis, left shoulder: Secondary | ICD-10-CM | POA: Diagnosis not present

## 2020-07-29 LAB — BASIC METABOLIC PANEL
Anion gap: 11 (ref 5–15)
BUN: 21 mg/dL (ref 8–23)
CO2: 22 mmol/L (ref 22–32)
Calcium: 8.7 mg/dL — ABNORMAL LOW (ref 8.9–10.3)
Chloride: 102 mmol/L (ref 98–111)
Creatinine, Ser: 1.41 mg/dL — ABNORMAL HIGH (ref 0.61–1.24)
GFR, Estimated: 51 mL/min — ABNORMAL LOW (ref >60)
Glucose, Bld: 128 mg/dL — ABNORMAL HIGH (ref 70–99)
Potassium: 4 mmol/L (ref 3.5–5.1)
Sodium: 135 mmol/L (ref 135–145)

## 2020-07-29 LAB — CBC
HCT: 39.4 % (ref 39.0–52.0)
Hemoglobin: 12.6 g/dL — ABNORMAL LOW (ref 13.0–17.0)
MCH: 29.4 pg (ref 26.0–34.0)
MCHC: 32 g/dL (ref 30.0–36.0)
MCV: 91.8 fL (ref 80.0–100.0)
Platelets: 122 10*3/uL — ABNORMAL LOW (ref 150–400)
RBC: 4.29 MIL/uL (ref 4.22–5.81)
RDW: 16.3 % — ABNORMAL HIGH (ref 11.5–15.5)
WBC: 6.6 10*3/uL (ref 4.0–10.5)
nRBC: 0 % (ref 0.0–0.2)

## 2020-07-29 MED ORDER — TIZANIDINE HCL 4 MG PO TABS: 4.0000 mg | ORAL_TABLET | Freq: Three times a day (TID) | ORAL | 1 refills | Status: AC | PRN

## 2020-07-29 MED ORDER — OXYCODONE HCL 10 MG PO TABS
5.0000 mg | ORAL_TABLET | Freq: Three times a day (TID) | ORAL | 0 refills | Status: AC | PRN
Start: 2020-07-29 — End: 2021-07-29

## 2020-07-29 NOTE — Plan of Care (Signed)
POC initiated 

## 2020-07-29 NOTE — Plan of Care (Signed)
  Problem: Education: Goal: Knowledge of General Education information will improve Description: Including pain rating scale, medication(s)/side effects and non-pharmacologic comfort measures Outcome: Adequate for Discharge   Problem: Health Behavior/Discharge Planning: Goal: Ability to manage health-related needs will improve Outcome: Adequate for Discharge   Problem: Clinical Measurements: Goal: Ability to maintain clinical measurements within normal limits will improve Outcome: Adequate for Discharge Goal: Will remain free from infection Outcome: Adequate for Discharge Goal: Diagnostic test results will improve Outcome: Adequate for Discharge Goal: Respiratory complications will improve Outcome: Adequate for Discharge Goal: Cardiovascular complication will be avoided Outcome: Adequate for Discharge   Problem: Activity: Goal: Risk for activity intolerance will decrease Outcome: Adequate for Discharge   Problem: Nutrition: Goal: Adequate nutrition will be maintained Outcome: Adequate for Discharge   Problem: Coping: Goal: Level of anxiety will decrease Outcome: Adequate for Discharge   Problem: Elimination: Goal: Will not experience complications related to bowel motility Outcome: Adequate for Discharge Goal: Will not experience complications related to urinary retention Outcome: Adequate for Discharge   Problem: Pain Managment: Goal: General experience of comfort will improve Outcome: Adequate for Discharge   Problem: Safety: Goal: Ability to remain free from injury will improve Outcome: Adequate for Discharge   Problem: Skin Integrity: Goal: Risk for impaired skin integrity will decrease Outcome: Adequate for Discharge   Problem: Education: Goal: Knowledge of the prescribed therapeutic regimen will improve Outcome: Adequate for Discharge Goal: Understanding of activity limitations/precautions following surgery will improve Outcome: Adequate for  Discharge Goal: Individualized Educational Video(s) Outcome: Adequate for Discharge   Problem: Activity: Goal: Ability to tolerate increased activity will improve Outcome: Adequate for Discharge   Problem: Pain Management: Goal: Pain level will decrease with appropriate interventions Outcome: Adequate for Discharge   

## 2020-07-29 NOTE — Progress Notes (Signed)
   PATIENT ID: Timothy Ashley   1 Day Post-Op Procedure(s) (LRB): REVERSE SHOULDER ARTHROPLASTY (Left)  Subjective:doing well, L shoulder still numb but getting function of hand and fingers. Just completed OT, hopeful to go home asap.   Objective:  Vitals:   07/29/20 0200 07/29/20 0513  BP: 140/70 139/65  Pulse: 75 76  Resp: 15 18  Temp: 99 F (37.2 C) 99.4 F (37.4 C)  SpO2: 96% 93%     L UE dressing c/d/i Wiggles fingers, still numb distally  Labs:  Recent Labs    07/29/20 0402  HGB 12.6*   Recent Labs    07/29/20 0402  WBC 6.6  RBC 4.29  HCT 39.4  PLT 122*   Recent Labs    07/29/20 0402 07/29/20 0723  NA QUESTIONABLE RESULTS, RECOMMEND RECOLLECT TO VERIFY 135  K QUESTIONABLE RESULTS, RECOMMEND RECOLLECT TO VERIFY 4.0  CL QUESTIONABLE RESULTS, RECOMMEND RECOLLECT TO VERIFY 102  CO2 QUESTIONABLE RESULTS, RECOMMEND RECOLLECT TO VERIFY 22  BUN QUESTIONABLE RESULTS, RECOMMEND RECOLLECT TO VERIFY 21  CREATININE QUESTIONABLE RESULTS, RECOMMEND RECOLLECT TO VERIFY 1.41*  GLUCOSE QUESTIONABLE RESULTS, RECOMMEND RECOLLECT TO VERIFY 128*  CALCIUM QUESTIONABLE RESULTS, RECOMMEND RECOLLECT TO VERIFY 8.7*    Assessment and Plan: 1 day s/p L shoulder arthrosplasty D/c home today  Fu in 2 weeks  VTE proph: scds

## 2020-07-29 NOTE — Evaluation (Signed)
Occupational Therapy Evaluation Patient Details Name: Timothy Ashley MRN: [id number] DOB: Aug 31, 1940 Today's Date: 07/29/2020    History of Present Illness s/p Left reverse shoulder arthroplasty   Clinical Impression   Timothy Ashley is a 79 year old man s/p shoulder replacement without functional use of left non-dominant upper extremity secondary to effects of surgery, interscalene block and shoulder precautions. Therapist provided education and instruction to patient and spouse in regards to exercises, precautions, positioning, donning clothing and bathing while maintaining shoulder precautions, ice and edema management and donning/doffing sling. Patient and spouse verbalized understanding and demonstrated as needed. Patient needed assistance to donn shirt, shorts, and shoes from spouse. Therapist provided education and instruction on precautions with dressing and educated on compensatory strategies to perform ADLs. Patient unsteady during evaluation and min assist for standing and transfers. Patient reports this is his baseline and uses a cane prn. Patient to follow up with MD for further therapy needs.      Follow Up Recommendations  Follow surgeon's recommendation for DC plan and follow-up therapies;Supervision/Assistance - 24 hour    Equipment Recommendations  None recommended by OT    Recommendations for Other Services       Precautions / Restrictions Precautions Precautions: Shoulder Type of Shoulder Precautions: NWB, No AROM, No PROM, okay for elbow, wrist and hand HEP Shoulder Interventions: Shoulder sling/immobilizer;At all times;Off for dressing/bathing/exercises Precaution Booklet Issued: Yes (comment) (handout) Required Braces or Orthoses: Sling Restrictions Weight Bearing Restrictions: Yes LUE Weight Bearing: Non weight bearing      Mobility Bed Mobility Overal bed mobility: Needs Assistance Bed Mobility: Supine to Sit     Supine to sit: Min assist     General  bed mobility comments: hand hold to rise    Transfers Overall transfer level: Needs assistance   Transfers: Sit to/from Stand;Stand Pivot Transfers Sit to Stand: Min assist;From elevated surface Stand pivot transfers: Min assist       General transfer comment: min assist for steadying    Balance Overall balance assessment: Mild deficits observed, not formally tested                                         ADL either performed or assessed with clinical judgement   ADL Overall ADL's : Needs assistance/impaired Eating/Feeding: Independent   Grooming: Modified independent   Upper Body Bathing: Minimal assistance   Lower Body Bathing: Moderate assistance   Upper Body Dressing : Maximal assistance Upper Body Dressing Details (indicate cue type and reason): assistance to don shirt and sling Lower Body Dressing: Maximal assistance Lower Body Dressing Details (indicate cue type and reason): assistance to don shorts and shoes, educated on shoulder precautions and patient not to pull clothing up with surgery arm or manage buttons. Toilet Transfer: Designer, fashion/clothing and Hygiene: Maximal assistance Toileting - Clothing Manipulation Details (indicate cue type and reason): for clothing management, recommended alternate clothing to ease task.             Vision Patient Visual Report: No change from baseline       Perception     Praxis      Pertinent Vitals/Pain Pain Assessment: No/denies pain     Hand Dominance Right   Extremity/Trunk Assessment Upper Extremity Assessment Upper Extremity Assessment: LUE deficits/detail;RUE deficits/detail RUE Deficits / Details: decreased shoulder ROM from prior injury/surgeries RUE Sensation: WNL RUE Coordination:  WNL LUE Deficits / Details: Non functional use of LUE secondary to shoulder precautions and effects of interscalene block.   Lower Extremity Assessment Lower Extremity  Assessment: Overall WFL for tasks assessed (reports numbness/tingling in bilatral feet secondary to neuropathy)   Cervical / Trunk Assessment Cervical / Trunk Assessment: Normal   Communication Communication Communication: No difficulties   Cognition Arousal/Alertness: Awake/alert Behavior During Therapy: WFL for tasks assessed/performed Overall Cognitive Status: Within Functional Limits for tasks assessed                                     General Comments  Reports balance deficits at baseline    Exercises     Shoulder Instructions Shoulder Instructions Donning/doffing shirt without moving shoulder: Patient able to independently direct caregiver;Caregiver independent with task Method for sponge bathing under operated UE: Patient able to independently direct caregiver;Caregiver independent with task Donning/doffing sling/immobilizer: Patient able to independently direct caregiver;Caregiver independent with task Correct positioning of sling/immobilizer: Patient able to independently direct caregiver;Caregiver independent with task ROM for elbow, wrist and digits of operated UE: Patient able to independently direct caregiver;Caregiver independent with task Sling wearing schedule (on at all times/off for ADL's): Caregiver independent with task;Patient able to independently direct caregiver Proper positioning of operated UE when showering: Patient able to independently direct caregiver;Caregiver independent with task Dressing change: Patient able to independently direct caregiver;Caregiver independent with task Positioning of UE while sleeping: Caregiver independent with task;Patient able to independently direct caregiver    Home Living Family/patient expects to be discharged to:: Private residence Living Arrangements: Spouse/significant other   Type of Home: House Home Access: Ramped entrance           Bathroom Shower/Tub: Hospital doctor Toilet:  Handicapped height     Home Equipment: [place] - single point;Shower seat;Toilet riser;Grab bars - tub/shower;Grab bars - toilet          Prior Functioning/Environment Level of Independence: Needs assistance  Gait / Transfers Assistance Needed: used cane PRN ADL's / Homemaking Assistance Needed: Had assistance with UB dressing            OT Problem List: Decreased strength;Decreased range of motion;Decreased coordination;Pain;Impaired UE functional use      OT Treatment/Interventions:      OT Goals(Current goals can be found in the care plan section) Acute Rehab OT Goals Patient Stated Goal: For arm to recover well OT Goal Formulation: All assessment and education complete, DC therapy  OT Frequency:     Barriers to D/C:            Co-evaluation              AM-PAC OT "6 Clicks" Daily Activity     Outcome Measure Help from another person eating meals?: A Little Help from another person taking care of personal grooming?: None Help from another person toileting, which includes using toliet, bedpan, or urinal?: A Lot Help from another person bathing (including washing, rinsing, drying)?: A Lot Help from another person to put on and taking off regular upper body clothing?: A Lot Help from another person to put on and taking off regular lower body clothing?: A Lot 6 Click Score: 15   End of Session Equipment Utilized During Treatment:  (sling) Nurse Communication:  (OT education complete)  Activity Tolerance: Patient tolerated treatment well Patient left: in chair;with call bell/phone within reach;with family/visitor present  OT Visit Diagnosis:  Pain;Muscle weakness (generalized) (M62.81) Pain - Right/Left: Left Pain - part of body: Shoulder                Time: 7793-9688 OT Time Calculation (min): 27 min Charges:  OT General Charges $OT Visit: 1 Visit OT Evaluation $OT Eval Low Complexity: 1 Low OT Treatments $Self Care/Home Management : 8-22 mins  Raynald Rouillard,  OTR/L [place] [phone] Pager: [place] 07/29/2020, 8:43 AM

## 2020-07-29 NOTE — Discharge Instructions (Signed)
Discharge Instructions after Reverse Total Shoulder Arthroplasty   . A sling has been provided for you. You are to wear this at all times (except for bathing and dressing), until your first post operative visit with Dr. Tamera Punt. Please also wear while sleeping at night. While you bath and dress, let the arm/elbow extend straight down to stretch your elbow. Wiggle your fingers and pump your first while your in the sling to prevent hand swelling. . Use ice on the shoulder intermittently over the first 48 hours after surgery. Continue to use ice or and ice machine as needed after 48 hours for pain control/swelling.  . Pain medicine has been prescribed for you.  . Use your medicine liberally over the first 48 hours, and then you can begin to taper your use. You may take Extra Strength Tylenol or Tylenol only in place of the pain pills. DO NOT take ANY nonsteroidal anti-inflammatory pain medications: Advil, Motrin, Ibuprofen, Aleve, Naproxen or Naprosyn.  . Take one aspirin a day for 2 weeks after surgery, unless you have an aspirin sensitivity/allergy or asthma.   Dental Antibiotics:  In most cases prophylactic antibiotics for Dental procdeures after total joint surgery are not necessary.  Exceptions are as follows:  1. History of prior total joint infection  2. Severely immunocompromised (Organ Transplant, cancer chemotherapy, Rheumatoid biologic meds such as [place])  3. Poorly controlled diabetes (A1C &gt; 8.0, blood glucose over 200)  If you have one of these conditions, contact your surgeon for an antibiotic prescription, prior to your dental procedure. . Leave your dressing on until your first follow up visit.  You may shower with the dressing.  Hold your arm as if you still have your sling on while you shower. . Simply allow the water to wash over the site and then pat dry. Make sure your axilla (armpit) is completely dry after showering.    Please call [phone] during normal  business hours or [phone] after hours for any problems. Including the following:  - excessive redness of the incisions - drainage for more than 4 days - fever of more than 101.5 F  *Please note that pain medications will not be refilled after hours or on weekends.

## 2020-08-04 ENCOUNTER — Encounter (HOSPITAL_COMMUNITY): Payer: Self-pay | Admitting: Orthopedic Surgery

## 2020-08-04 NOTE — Discharge Summary (Signed)
Patient ID: Timothy Ashley MRN: [id number] DOB/AGE: 1941/01/30 79 y.o.  Admit date: 07/28/2020 Discharge date: 07/29/2020  Admission Diagnoses:  Active Problems:   S/P reverse total shoulder arthroplasty, left   Discharge Diagnoses:  Same  Past Medical History:  Diagnosis Date  . Arthritis   . Cancer (HCC)    hx of skin cancer   . Chronic kidney disease    stage 2 -   . Hypertension   . 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 Pneumonia     Surgeries: Procedure(s): REVERSE SHOULDER ARTHROPLASTY on 07/28/2020   Consultants:   Discharged Condition: Improved  Hospital Course: Jovanne Riggenbach is an 79 y.o. male who was admitted 07/28/2020 for operative treatment of left rotator cuff tear arthropathy. Patient has severe unremitting pain that affects sleep, daily activities, and work/hobbies. After pre-op clearance the patient was taken to the operating room on 07/28/2020 and underwent  Procedure(s): [place].    Patient was given perioperative antibiotics:  Anti-infectives (From admission, onward)   Start     Dose/Rate Route Frequency Ordered Stop   07/29/20 1000  cephALEXin (KEFLEX) capsule 500 mg  Status:  Discontinued        500 mg Oral 2 times daily 07/28/20 1317 07/29/20 1544   07/28/20 1600  ceFAZolin (ANCEF) IVPB 2g/100 mL premix        2 g 200 mL/hr over 30 Minutes Intravenous Every 6 hours 07/28/20 1145 07/29/20 0431   07/28/20 0600  ceFAZolin (ANCEF) 3 g in dextrose 5 % 50 mL IVPB        3 g 100 mL/hr over 30 Minutes Intravenous On call to O.R. 07/27/20 0739 07/28/20 1028       Patient was given sequential compression devices, early ambulation, and chemoprophylaxis to prevent DVT.  Patient benefited maximally from hospital stay and there were no complications.    Recent vital signs: No data found.   Recent laboratory studies: No results for input(s): WBC, HGB, HCT, PLT, NA, K, CL, CO2, BUN, CREATININE, GLUCOSE, INR, CALCIUM in  the last 72 hours.  Invalid input(s): PT, 2   Discharge Medications:   Allergies as of 07/29/2020      Reactions   Statins Anxiety, Other (See Comments)   Myalgia     Nsaids    Stage 2 kidney failure    Other    Muscle Relaxants - stage 2 Kidney failure    Tegaderm Ag Mesh [silver]    Causes blisters on skin       Medication List    TAKE these medications   aspirin EC 81 MG tablet Take 81 mg by mouth daily. Swallow whole.   b complex vitamins capsule Take 1 capsule by mouth daily.   CALCIUM + VITAMIN D3 PO Take 1 tablet by mouth in the morning and at bedtime. 1200 mg /1000 units   carvedilol 6.25 MG tablet Commonly known as: COREG Take 6.25 mg by mouth 2 (two) times daily with a meal.   cephALEXin 500 MG capsule Commonly known as: KEFLEX Take 500 mg by mouth 2 (two) times daily.   cholecalciferol 25 MCG (1000 UNIT) tablet Commonly known as: VITAMIN D3 Take 1,000 Units by mouth daily.   Coenzyme Q10 60 MG Tabs Take 60 mg by mouth daily. With Red Yeast Rice   COMPLETE PROSTATE HEALTH PO Take 1 capsule by mouth in the morning and at bedtime. Super Beta Prostate Advanced   hydrochlorothiazide 25  MG tablet Commonly known as: HYDRODIURIL Take 25 mg by mouth daily.   latanoprost 0.005 % ophthalmic solution Commonly known as: XALATAN Place 1 drop into both eyes at bedtime.   loratadine 10 MG tablet Commonly known as: CLARITIN Take 10 mg by mouth at bedtime.   OVER THE COUNTER MEDICATION Take 2 capsules by mouth in the morning and at bedtime. [place] Herbal Laxative   Oxycodone HCl 10 MG Tabs Take 0.5-1 tablets (5-10 mg total) by mouth every 8 (eight) hours as needed.   oxyCODONE-acetaminophen 5-325 MG tablet Commonly known as: PERCOCET/ROXICET Take 1 tablet by mouth in the morning, at noon, in the evening, and at bedtime.   polyethylene glycol 17 g packet Commonly known as: MIRALAX / GLYCOLAX Take 17 g by mouth daily. Clear Lax   pregabalin 100 MG  capsule Commonly known as: LYRICA Take 100 mg by mouth 2 (two) times daily.   rosuvastatin 5 MG tablet Commonly known as: CRESTOR Take 5 mg by mouth every evening.   salsalate 500 MG tablet Commonly known as: DISALCID Take 500 mg by mouth 2 (two) times daily.   sennosides-docusate sodium 8.6-50 MG tablet Commonly known as: SENOKOT-S Take 2 tablets by mouth at bedtime.   testosterone cypionate 100 MG/ML injection Commonly known as: DEPOTESTOTERONE CYPIONATE Inject 100 mg into the muscle every 14 (fourteen) days. For IM use only   tiZANidine 4 MG tablet Commonly known as: Zanaflex Take 1 tablet (4 mg total) by mouth every 8 (eight) hours as needed for muscle spasms.   TURMERIC PO Take 800 mg by mouth in the morning and at bedtime.   vitamin B-12 1000 MCG tablet Commonly known as: CYANOCOBALAMIN Take 1,000 mcg by mouth daily.       Diagnostic Studies: DG Chest 2 View  Result Date: 07/25/2020 CLINICAL DATA:  Preop evaluation. EXAM: CHEST - 2 VIEW COMPARISON:  None. FINDINGS: Minimal patchy bibasilar opacities, likely atelectasis. No pneumothorax or pleural effusion. Cardiomediastinal silhouette within normal limits. Partially imaged posterior spinal fusion hardware. 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 Sequela of right shoulder arthroplasty. Left glenohumeral osteoarthrosis. IMPRESSION: Minimal patchy bibasilar opacities, likely atelectasis. Otherwise no focal airspace disease. Electronically Signed   By: Primitivo Gauze M.D.   On: 07/25/2020 09:42   CT SHOULDER LEFT WO CONTRAST  Result Date: 07/20/2020 CLINICAL DATA:  Chronic right shoulder pain. Preoperative planning study. EXAM: CT OF THE UPPER LEFT EXTREMITY WITHOUT CONTRAST TECHNIQUE: Multidetector CT imaging of the upper left extremity was performed according to the standard protocol. COMPARISON:  None. FINDINGS: Bones/Joint/Cartilage The patient has bone-on-bone glenohumeral osteoarthritis. The  glenoid is flattened and remodeled. Multiple small subchondral cysts are seen in both the humeral head and glenoid. Largest single cyst in the glenoid measures 1 cm AP x 0.6 cm transverse x 0.9 cm craniocaudal. Subchondral sclerosis is present about the joint and there is an osteophyte off the humeral head. Glenohumeral joint effusion is noted. The patient has moderately severe appearing acromioclavicular osteoarthritis. The acromion is type 2. Very small subacromial spur noted. There is no acute bony abnormality but the patient has a remote healed fracture of the distal clavicle and remote healed left rib fractures. No lytic or sclerotic lesion. Ligaments Suboptimally assessed by CT. Muscles and Tendons Fluid is seen in the sheath of the long head of biceps and there are some calcifications in the tendon. As visualized by CT, the rotator cuff appears intact with multiple punctate calcifications in the tendons. No muscle atrophy. Soft  tissues Imaged lung parenchyma is clear. Calcific aortic and coronary atherosclerosis noted. IMPRESSION: Severe glenohumeral osteoarthritis with a joint effusion as described above. Moderately severe appearing acromioclavicular osteoarthritis. Healed distal clavicle and left rib fractures. Calcific rotator cuff and long head of biceps tendinopathy. Electronically Signed   By: Inge Rise M.D.   On: 07/20/2020 11:57   DG Shoulder Left Port  Result Date: 07/28/2020 CLINICAL DATA:  Left shoulder replacement. EXAM: LEFT SHOULDER COMPARISON:  CT left shoulder dated July 20, 2020. FINDINGS: The left shoulder demonstrates a reverse total arthroplasty without evidence of hardware failure or complication. There is no fracture or dislocation. The alignment is anatomic. Post-surgical changes noted in the surrounding soft tissues. Left basilar atelectasis. IMPRESSION: 1. Interval left reverse total shoulder arthroplasty without evidence of acute postoperative complication.  Electronically Signed   By: Titus Dubin M.D.   On: 07/28/2020 13:00    Disposition: Discharge disposition: 01-Home or Self Care       Discharge Instructions    Call MD / Call 911   Complete by: As directed    If you experience chest pain or shortness of breath, CALL 911 and be transported to the hospital emergency room.  If you develope a fever above 101 F, pus (white drainage) or increased drainage or redness at the wound, or calf pain, call your surgeon's office.   Constipation Prevention   Complete by: As directed    Drink plenty of fluids.  Prune juice may be helpful.  You may use a stool softener, such as Colace (over the counter) 100 mg twice a day.  Use MiraLax (over the counter) for constipation as needed.   Diet - low sodium heart healthy   Complete by: As directed    Increase activity slowly as tolerated   Complete by: As directed        Follow-up Information    Tania Ade, MD. Schedule an appointment as soon as possible for a visit in 2 weeks.   Specialty: Orthopedic Surgery Contact information: [place] [address] [phone]                Signed: Grier Mitts 08/04/2020, 9:14 AM

## 2020-08-07 NOTE — Anesthesia Postprocedure Evaluation (Signed)
Anesthesia Post Note  Patient: Clee Pandit  Procedure(s) Performed: REVERSE SHOULDER ARTHROPLASTY (Left Shoulder)     Patient location during evaluation: PACU Anesthesia Type: General Level of consciousness: awake and alert Pain management: pain level controlled Vital Signs Assessment: post-procedure vital signs reviewed and stable Respiratory status: spontaneous breathing, nonlabored ventilation, respiratory function stable and patient connected to nasal cannula oxygen Cardiovascular status: blood pressure returned to baseline and stable Postop Assessment: no apparent nausea or vomiting Anesthetic complications: no   No complications documented.  Last Vitals:  Vitals:   07/29/20 0513 07/29/20 0944  BP: 139/65 136/67  Pulse: 76 82  Resp: 18 17  Temp: 37.4 C 36.6 C  SpO2: 93% 96%    Last Pain:  Vitals:   07/29/20 0946  TempSrc:   PainSc: 7                  [place] Leedy

## 2021-09-10 IMAGING — DX DG CHEST 2V
2 series · 2 of 2 positions shown · non-contrast
Comparison: None.

CLINICAL DATA: Preop evaluation.

EXAM:
CHEST - 2 VIEW

[chest pa]
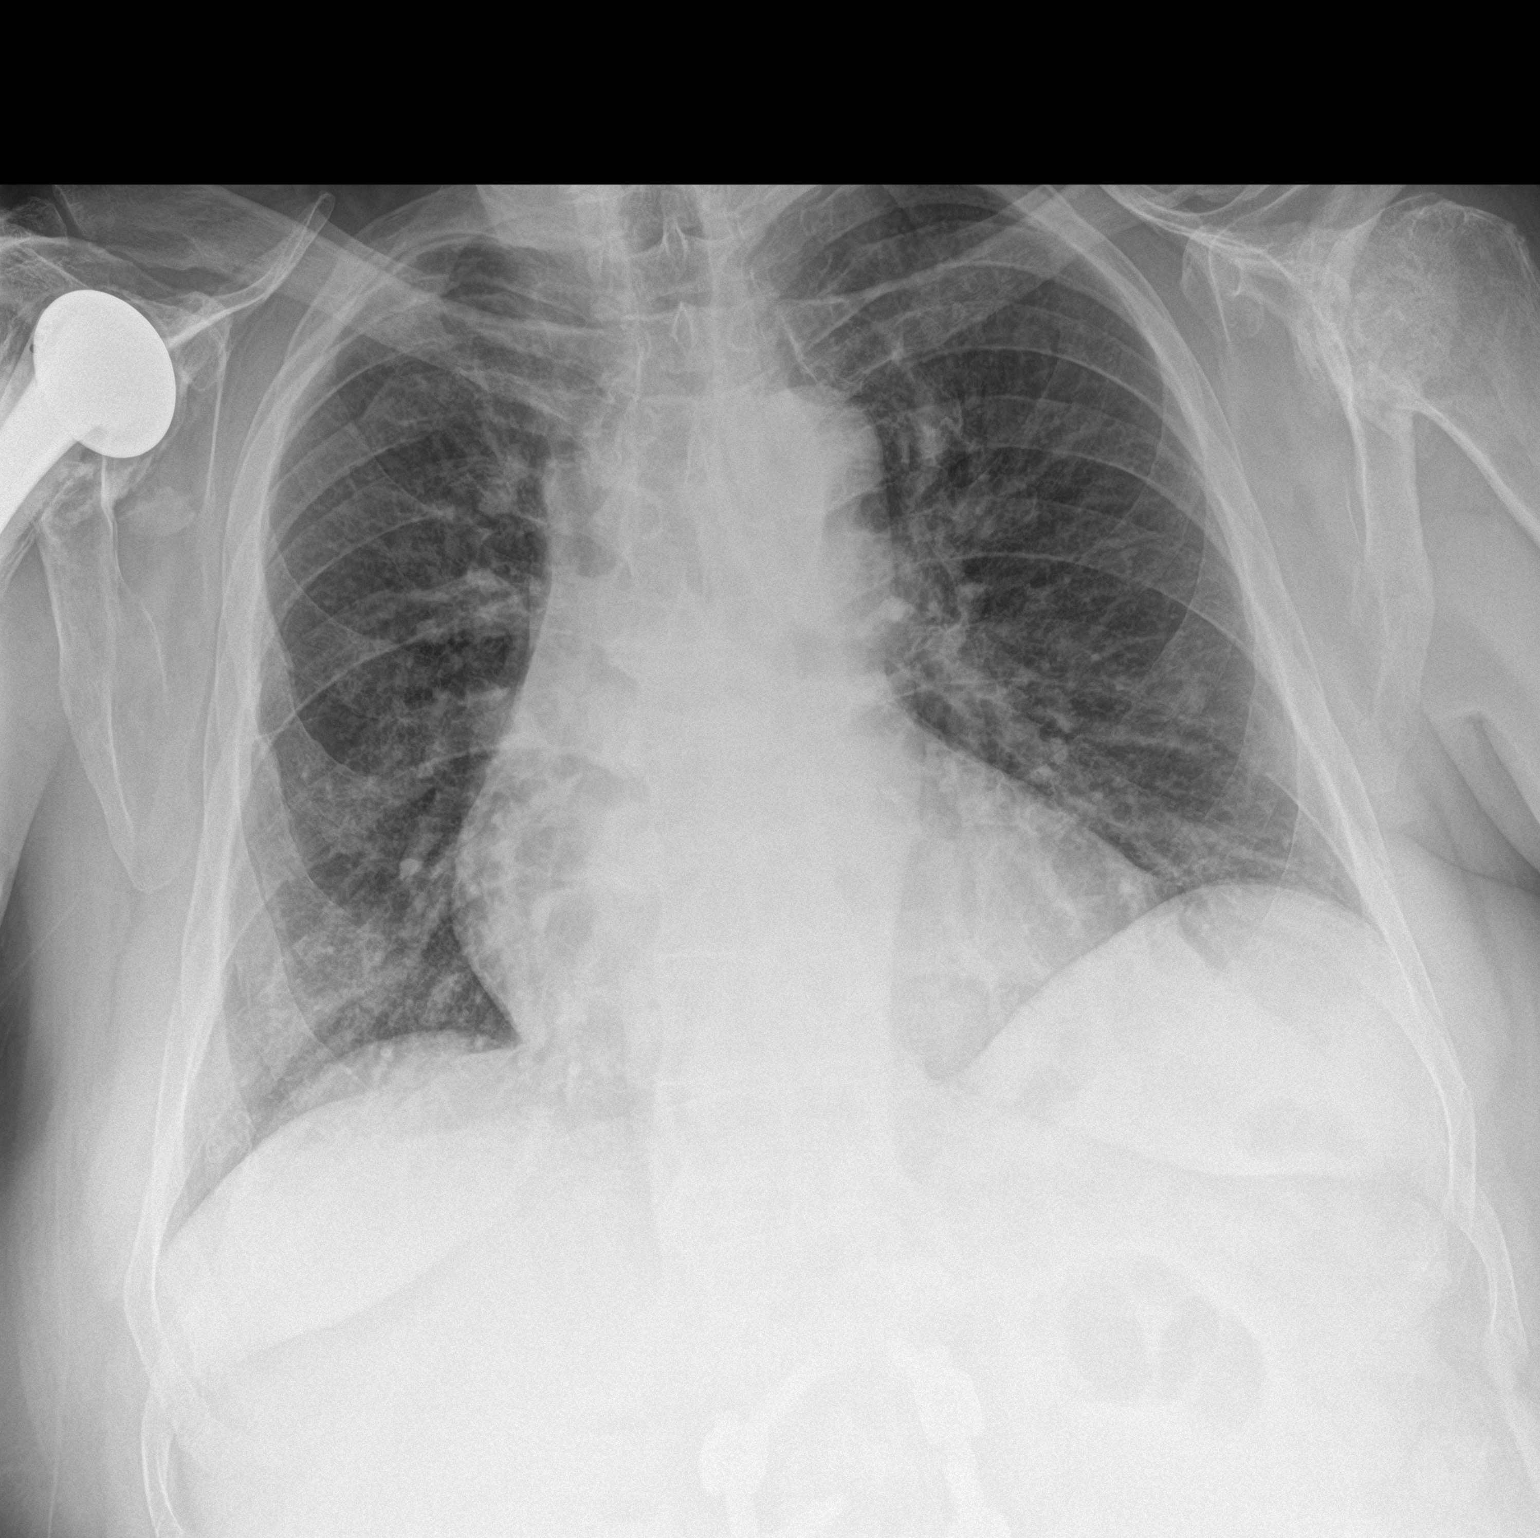

[chest lat]
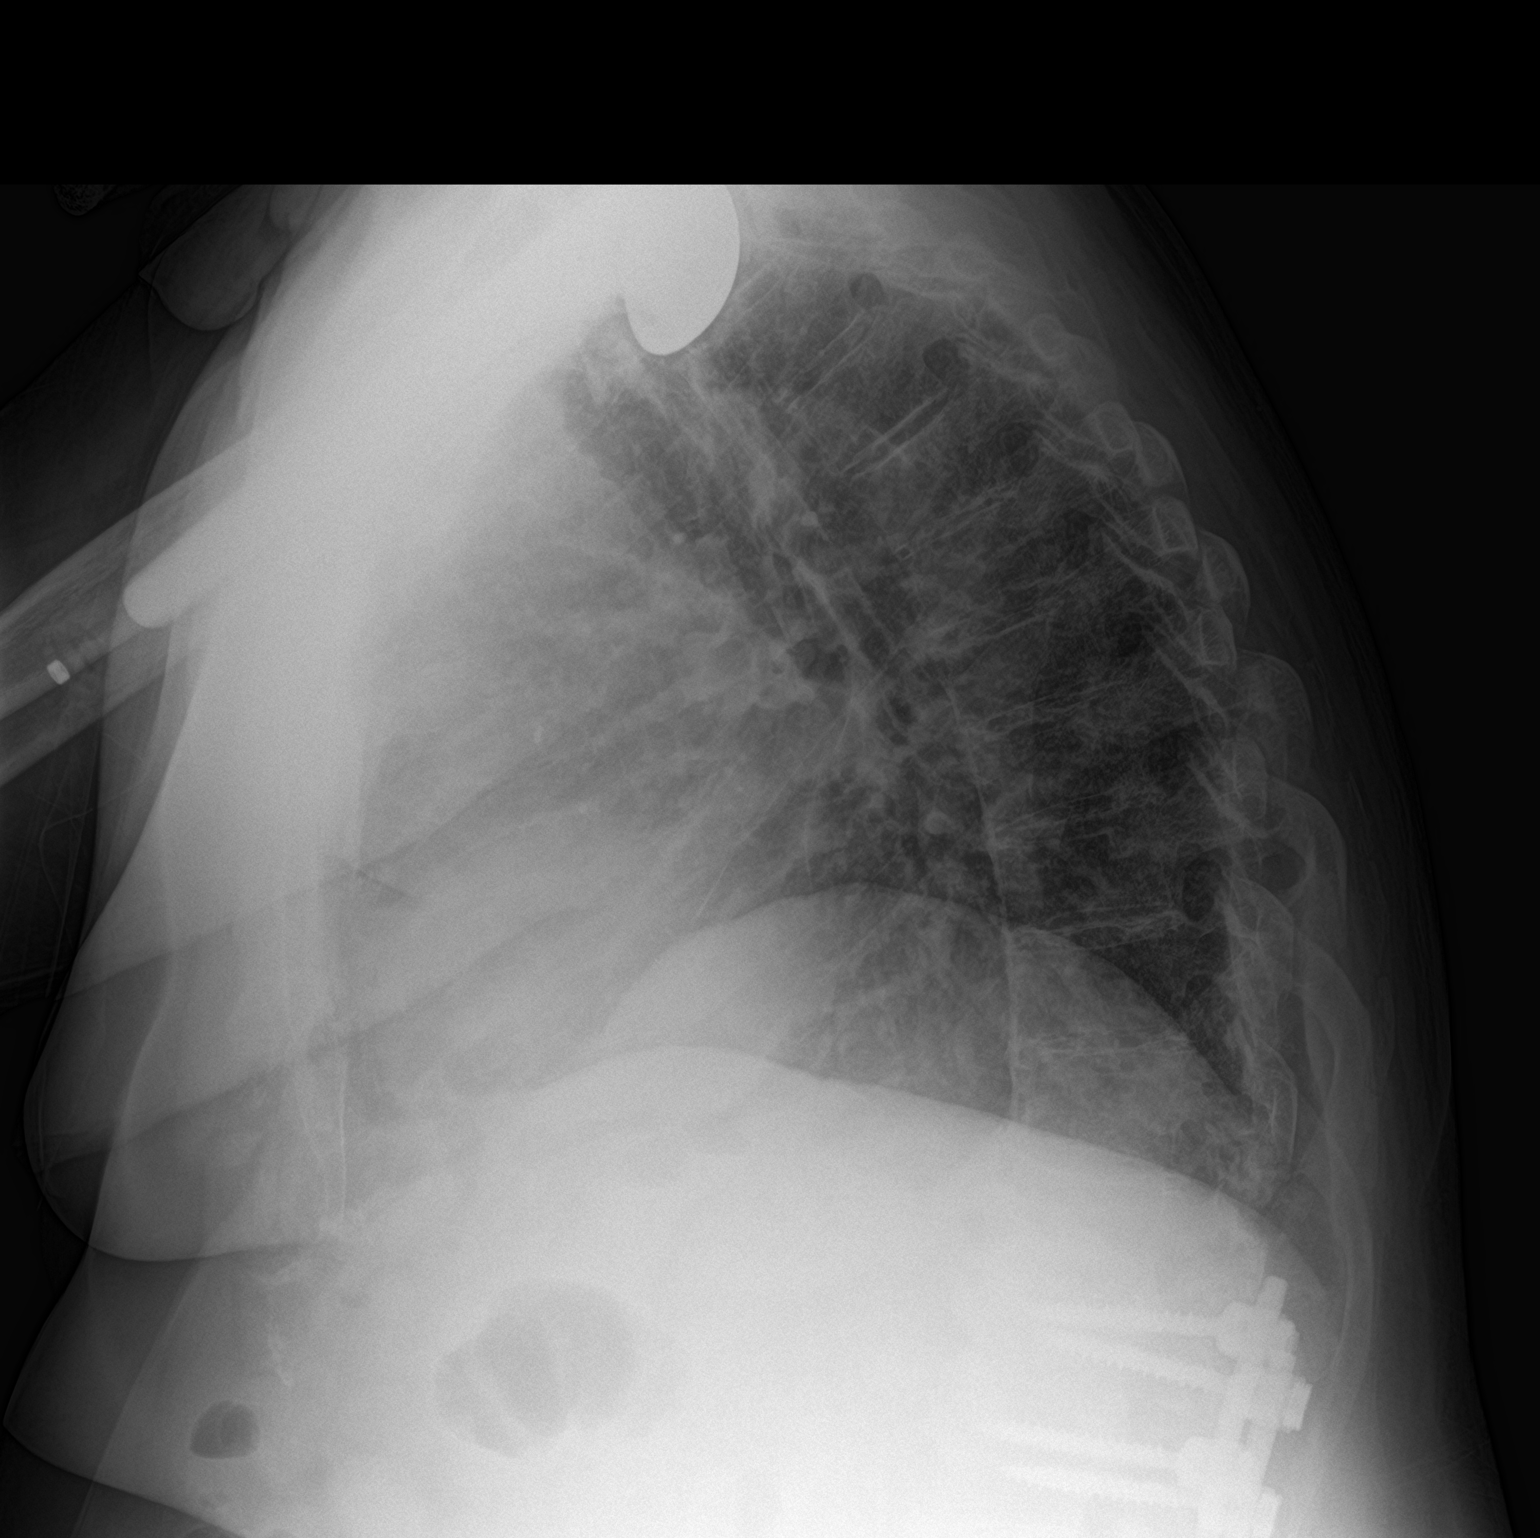

[2 of 2 positions shown; findings below may reference images not displayed]

FINDINGS: Minimal patchy bibasilar opacities, likely atelectasis. No
pneumothorax or pleural effusion. Cardiomediastinal silhouette
within normal limits. Partially imaged posterior spinal fusion
hardware. Bridging ossification of the anterior longitudinal
ligament.

Sequela of right shoulder arthroplasty. Left glenohumeral
osteoarthrosis.
IMPRESSION: Minimal patchy bibasilar opacities, likely atelectasis.

Otherwise no focal airspace disease.
